# Patient Record
Sex: Male | Born: 1945 | Race: Black or African American | Hispanic: No | Marital: Married
Health system: Southern US, Community
[De-identification: ages and names within clinical notes are randomized; demographics above are authoritative.]

---

## 2005-08-15 ENCOUNTER — Emergency Department (HOSPITAL_COMMUNITY): Admission: EM | Admit: 2005-08-15 | Discharge: 2005-08-15 | Payer: Self-pay | Admitting: Emergency Medicine

## 2017-04-30 ENCOUNTER — Emergency Department (HOSPITAL_COMMUNITY): Payer: Medicare (Managed Care)

## 2017-04-30 ENCOUNTER — Emergency Department (HOSPITAL_COMMUNITY)
Admission: EM | Admit: 2017-04-30 | Discharge: 2017-05-01 | Disposition: A | Discharge status: Home / Self Care | Payer: Medicare (Managed Care) | Attending: Emergency Medicine | Admitting: Emergency Medicine

## 2017-04-30 DIAGNOSIS — Y999 Unspecified external cause status: Secondary | ICD-10-CM | POA: Diagnosis not present

## 2017-04-30 DIAGNOSIS — Y9389 Activity, other specified: Secondary | ICD-10-CM | POA: Diagnosis not present

## 2017-04-30 DIAGNOSIS — W228XXA Striking against or struck by other objects, initial encounter: Secondary | ICD-10-CM | POA: Diagnosis not present

## 2017-04-30 DIAGNOSIS — Y929 Unspecified place or not applicable: Secondary | ICD-10-CM | POA: Diagnosis not present

## 2017-04-30 DIAGNOSIS — Z79899 Other long term (current) drug therapy: Secondary | ICD-10-CM | POA: Insufficient documentation

## 2017-04-30 DIAGNOSIS — Z7984 Long term (current) use of oral hypoglycemic drugs: Secondary | ICD-10-CM | POA: Insufficient documentation

## 2017-04-30 DIAGNOSIS — S060X0A Concussion without loss of consciousness, initial encounter: Secondary | ICD-10-CM

## 2017-04-30 DIAGNOSIS — S0990XA Unspecified injury of head, initial encounter: Secondary | ICD-10-CM | POA: Diagnosis present

## 2017-04-30 DIAGNOSIS — Z7901 Long term (current) use of anticoagulants: Secondary | ICD-10-CM | POA: Insufficient documentation

## 2017-04-30 LAB — BASIC METABOLIC PANEL
Anion gap: 11 (ref 5–15)
BUN: 11 mg/dL (ref 6–20)
CALCIUM: 8.6 mg/dL — AB (ref 8.9–10.3)
CO2: 27 mmol/L (ref 22–32)
Chloride: 99 mmol/L — ABNORMAL LOW (ref 101–111)
Creatinine, Ser: 1.14 mg/dL (ref 0.61–1.24)
GFR calc Af Amer: >60 mL/min (ref >60)
GLUCOSE: 206 mg/dL — AB (ref 65–99)
Potassium: 4.1 mmol/L (ref 3.5–5.1)
Sodium: 137 mmol/L (ref 135–145)

## 2017-04-30 LAB — CBC
HEMATOCRIT: 51.4 % (ref 39.0–52.0)
Hemoglobin: 16.5 g/dL (ref 13.0–17.0)
MCH: 27.1 pg (ref 26.0–34.0)
MCHC: 32.1 g/dL (ref 30.0–36.0)
MCV: 84.4 fL (ref 78.0–100.0)
PLATELETS: 183 10*3/uL (ref 150–400)
RBC: 6.09 MIL/uL — ABNORMAL HIGH (ref 4.22–5.81)
RDW: 15.5 % (ref 11.5–15.5)
WBC: 11.1 10*3/uL — ABNORMAL HIGH (ref 4.0–10.5)

## 2017-04-30 LAB — I-STAT TROPONIN, ED: Troponin i, poc: 0 ng/mL (ref 0.00–0.08)

## 2017-04-30 NOTE — ED Triage Notes (Signed)
Pt states on 4/04 he closed his trunk on his head and became "swimming headed" afterwards and dizziness. Pt states the dizziness has been there ever since but comes and goes decided to come get checked out today.

## 2017-04-30 NOTE — ED Provider Notes (Signed)
Patient placed in Quick Look pathway, seen and evaluated   Chief Complaint: Intermittent dizziness  HPI: Patient presenting with episodes of intermittent dizziness over the past 10 days. He reports that he hit his head on the trunk of his vehicle 10 days ago and felt a little bit dizzy but did not get evaluated.  He describes the dizziness as "swimming head" room spinning and feeling like he was going to pass out. Positive anticoagulant use. Denies any headaches, visual disturbances, nausea, vomiting.  ROS: Reports chronic shortness of breath from COPD without worsening, no chest pain, headache, fever or chills.  Physical Exam:   Gen: No distress  Neuro: Awake and Alert  Skin: Warm    Focused Exam: Nontoxic afebrile sitting comfortably in chair in no acute distress.  Lungs with rhonchi bilaterally.    Initiation of care has begun. The patient has been counseled on the process, plan, and necessity for staying for the completion/evaluation, and the remainder of the medical screening examination    Gregary CromerMitchell, Ranie Chinchilla B, PA-C 04/30/17 1722    Margarita Grizzleay, Danielle, MD 05/01/17 1438

## 2017-05-01 ENCOUNTER — Encounter (HOSPITAL_COMMUNITY): Payer: Self-pay

## 2017-05-01 MED ORDER — ALBUTEROL SULFATE (2.5 MG/3ML) 0.083% IN NEBU
5.0000 mg | INHALATION_SOLUTION | Freq: Once | RESPIRATORY_TRACT | Status: AC
  Administered 2017-05-01: 5 mg via RESPIRATORY_TRACT
  Filled 2017-05-01: qty 6

## 2017-05-01 MED ORDER — MECLIZINE HCL 25 MG PO TABS
25.0000 mg | ORAL_TABLET | Freq: Once | ORAL | Status: AC
  Administered 2017-05-01: 25 mg via ORAL
  Filled 2017-05-01: qty 1

## 2017-05-01 MED ORDER — MECLIZINE HCL 25 MG PO TABS: 12.5000 mg | ORAL_TABLET | Freq: Three times a day (TID) | ORAL | 0 refills | Status: AC | PRN

## 2017-05-01 MED ORDER — IPRATROPIUM BROMIDE 0.02 % IN SOLN
0.5000 mg | Freq: Once | RESPIRATORY_TRACT | Status: AC
Start: 2017-05-01 — End: 2017-05-01
  Administered 2017-05-01: 0.5 mg via RESPIRATORY_TRACT
  Filled 2017-05-01: qty 2.5

## 2017-05-01 NOTE — Discharge Instructions (Addendum)
Get help right away if: °You have confusion or unusual drowsiness. °Others find it difficult to wake you up. °You have nausea or persistent, forceful vomiting. °You feel like you are moving when you are not (vertigo). Your eyes may move rapidly back and forth. °You have convulsions or faint. °You have severe, persistent headaches that are not relieved by medicine. °You cannot use your arms or legs normally. °One of your pupils is larger than the other. °You have clear or bloody discharge from your nose or ears. °Your problems are getting worse, not better. °

## 2017-05-01 NOTE — ED Provider Notes (Signed)
Medical screening examination/treatment/procedure(s) were conducted as a shared visit with non-physician practitioner(s) and myself.  I personally evaluated the patient during the encounter.  Pt presented to the ED, for evaluation of dizziness following a head injury.  CT scan was negative for any acute finding.  Her symptoms are likely consistent with a mild concussion.  Patient feels stable for discharge   Linwood DibblesKnapp, Patrice Matthew, MD 05/01/17 [phone]

## 2017-05-01 NOTE — ED Notes (Signed)
Pt updated on bed status.  

## 2017-05-01 NOTE — ED Notes (Signed)
Provided update to son via phone.

## 2017-05-01 NOTE — ED Notes (Signed)
ED Provider at bedside. 

## 2017-05-01 NOTE — ED Provider Notes (Signed)
[facility] EMERGENCY DEPARTMENT Provider Note   CSN: [id number] Arrival date & time: 04/30/17  1633     History   Chief Complaint Chief Complaint  Patient presents with  . Head Injury    HPI Stephen Schultz is a 72 y.o. male.  Who presents the emergency department for evaluation of dizziness after head injury.  Patient states that 4 days ago he was putting suitcase in the trunk of his car and when he lifted up he hit his head.  Patient states that he "knocked the hell out of myself!"  He has had intermittent bouts of dizziness that are worse with turning his head lasting about 3-5 minutes at a time.  Patient states that when he sits still it gets better.  It is worse when he wakes up in the morning.  He denies nausea, vomiting, ataxia, headaches.  He has a past medical history of COPD and is on chronic oxygen therapy.  Patient has had a productive cough but denies fevers or chills.  HPI  History reviewed. No pertinent past medical history.  There are no active problems to display for this patient.   History reviewed. No pertinent surgical history.      Home Medications    Prior to Admission medications   Medication Sig Start Date End Date Taking? Authorizing Provider  albuterol (PROVENTIL HFA;VENTOLIN HFA) 108 (90 Base) MCG/ACT inhaler Inhale 1-2 puffs into the lungs every 6 (six) hours as needed for wheezing or shortness of breath.   Yes [provider]  Azelastine HCl 0.15 % SOLN Place 2 sprays into the nose 2 (two) times daily.   Yes [provider]  bumetanide (BUMEX) 2 MG tablet Take 4 mg by mouth 2 (two) times daily.   Yes [provider]  dabigatran (PRADAXA) 150 MG CAPS capsule Take 150 mg by mouth 2 (two) times daily.   Yes [provider]  diclofenac sodium (VOLTAREN) 1 % GEL Apply 2 g topically 4 (four) times daily as needed (pain).   Yes [provider]  diltiazem (TIAZAC) 360 MG 24 hr capsule Take 360  mg by mouth daily.   Yes [provider]  fluticasone-salmeterol (ADVAIR HFA) 230-21 MCG/ACT inhaler Inhale 2 puffs into the lungs 2 (two) times daily.   Yes [provider]  glimepiride (AMARYL) 4 MG tablet Take 4 mg by mouth 2 (two) times daily.   Yes [provider]  lisinopril (PRINIVIL,ZESTRIL) 10 MG tablet Take 10 mg by mouth daily.   Yes [provider]  metoprolol tartrate (LOPRESSOR) 25 MG tablet Take 25 mg by mouth 2 (two) times daily.   Yes [provider]  nitroGLYCERIN (NITROSTAT) 0.4 MG SL tablet Place 0.4 mg under the tongue every 5 (five) minutes as needed for chest pain.   Yes [provider]  oxyCODONE (OXYCONTIN) 40 mg 12 hr tablet Take 40 mg by mouth every 8 (eight) hours.   Yes [provider]  pantoprazole (PROTONIX) 40 MG tablet Take 40 mg by mouth daily.   Yes [provider]  potassium chloride (K-DUR,KLOR-CON) 10 MEQ tablet Take 10 mEq by mouth daily.   Yes [provider]  sitaGLIPtin (JANUVIA) 100 MG tablet Take 100 mg by mouth daily.   Yes [provider]  tamsulosin (FLOMAX) 0.4 MG CAPS capsule Take 0.4 mg by mouth daily.   Yes [provider]  testosterone cypionate (DEPOTESTOSTERONE CYPIONATE) 200 MG/ML injection Inject 100 mg into the muscle every 14 (  fourteen) days.   Yes [provider]  tiotropium (SPIRIVA) 18 MCG inhalation capsule Place 18 mcg into inhaler and inhale daily.   Yes [provider]  meclizine (ANTIVERT) 25 MG tablet Take 0.5-1 tablets (12.5-25 mg total) by mouth 3 (three) times daily as needed for dizziness. 05/01/17   Arthor CaptainHarris, Alyric Parkin, PA-C    Family History No family history on file.  Social History Social History   Tobacco Use  . Smoking status: Not on file  Substance Use Topics  . Alcohol use: Not on file  . Drug use: Not on file     Allergies   Metformin and related and Morphine and related   Review of  Systems Review of Systems  Ten systems reviewed and are negative for acute change, except as noted in the HPI.   Physical Exam Updated Vital Signs BP (!) 153/92   Pulse 81   Temp 98.2 F (36.8 C)   Resp 19   Ht 6' (1.829 m)   Wt 97.5 kg (215 lb)   SpO2 96%   BMI 29.16 kg/m   Physical Exam  Constitutional: He is oriented to person, place, and time. He appears well-developed and well-nourished. No distress.  HENT:  Head: Normocephalic and atraumatic.  Eyes: Conjunctivae are normal. No scleral icterus.  Neck: Normal range of motion. Neck supple.  Cardiovascular: Normal rate, regular rhythm and normal heart sounds.  Pulmonary/Chest: Effort normal and breath sounds normal. No respiratory distress.  ronchi  Abdominal: Soft. There is no tenderness.  Musculoskeletal: He exhibits no edema.  Neurological: He is alert and oriented to person, place, and time.  Speech is clear and goal oriented, follows commands Major Cranial nerves without deficit, no facial droop Normal strength in upper and lower extremities bilaterally including dorsiflexion and plantar flexion, strong and equal grip strength Sensation normal to light and sharp touch Moves extremities without ataxia, coordination intact Normal finger to nose and rapid alternating movements Neg romberg, no pronator drift Normal gait Normal heel-shin and balance  Skin: Skin is warm and dry. He is not diaphoretic.  Psychiatric: His behavior is normal.  Nursing note and vitals reviewed.    ED Treatments / Results  Labs (all labs ordered are listed, but only abnormal results are displayed) Labs Reviewed  CBC - Abnormal; Notable for the following components:      Result Value   WBC 11.1 (*)    RBC 6.09 (*)    All other components within normal limits  BASIC METABOLIC PANEL - Abnormal; Notable for the following components:   Chloride 99 (*)    Glucose, Bld 206 (*)    Calcium 8.6 (*)    All other components within normal  limits  I-STAT TROPONIN, ED    EKG None  Radiology Dg Chest 2 View  Result Date: 04/30/2017 CLINICAL DATA:  Dizziness after closing the trunk of his vehicle on his head on 04/19/2017. EXAM: CHEST - 2 VIEW COMPARISON:  None. FINDINGS: Moderate-sized left diaphragmatic eventration. Borderline enlarged cardiac silhouette and tortuous and calcified thoracic aorta. Mildly prominent pulmonary vasculature. Mild linear atelectasis or scarring at both lung bases. Thoracic spine degenerative changes. Bilateral shoulder degenerative changes. IMPRESSION: 1. Moderate-sized left diaphragmatic eventration. 2. Borderline cardiomegaly and mild pulmonary vascular congestion. 3. Mild bibasilar linear atelectasis or scarring. Electronically Signed   By: Beckie SaltsSteven  Reid M.D.   On: 04/30/2017 18:42   Ct Head Wo Contrast  Result Date: 04/30/2017 CLINICAL DATA:  Chronic headache with dizziness EXAM: CT HEAD  WITHOUT CONTRAST TECHNIQUE: Contiguous axial images were obtained from the base of the skull through the vertex without intravenous contrast. COMPARISON:  08/15/2005 FINDINGS: Brain: No acute territorial infarction, hemorrhage or intracranial mass is visualized. Stable ventricle size. Mild small vessel ischemic changes of the white matter. Moderate atrophy. Possible small old lacunar infarct right basal ganglia. Vascular: No hyperdense vessels. Carotid vascular calcification and vertebral artery calcification. Skull: Normal. Negative for fracture or focal lesion. Sinuses/Orbits: No acute finding. Other: None. IMPRESSION: 1. No CT evidence for acute intracranial abnormality. 2. Atrophy with mild small vessel ischemic changes of the white matter. Electronically Signed   By: Jasmine Pang M.D.   On: 04/30/2017 18:21    Procedures Procedures (including critical care time)  Medications Ordered in ED Medications  albuterol (PROVENTIL) (2.5 MG/3ML) 0.083% nebulizer solution 5 mg (5 mg Nebulization Given 05/01/17 0655)   ipratropium (ATROVENT) nebulizer solution 0.5 mg (0.5 mg Nebulization Given 05/01/17 0655)  meclizine (ANTIVERT) tablet 25 mg (25 mg Oral Given 05/01/17 0654)     Initial Impression / Assessment and Plan / ED Course  I have reviewed the triage vital signs and the nursing notes.  Pertinent labs & imaging results that were available during my care of the patient were reviewed by me and considered in my medical decision making (see chart for details).  Clinical Course as of May 02 819  Tue May 01, 2017  0636 DG Chest 2 View [AH]    Clinical Course User Index [AH] Arthor Captain, PA-C    7:01 AM BP (!) 158/97   Pulse 80   Temp 98.2 F (36.8 C)   Resp 19   Ht 6' (1.829 m)   Wt 97.5 kg (215 lb)   SpO2 98%   BMI 29.16 kg/m  Patient CT negative with normal neuro exam. The patient will be given a duoneb and meclizine.  CXR shows chronic diaphragmatic abnormality without evidence of pneumonia  8:18 AM BP (!) 153/92   Pulse 81   Temp 98.2 F (36.8 C)   Resp 19   Ht 6' (1.829 m)   Wt 97.5 kg (215 lb)   SpO2 96%   BMI 29.16 kg/m   The patient is ambulatory in the ED. He is improved after breathing tx. Seen in shared visit and Dr, Lynelle Doctor agrees with discharge.   Final Clinical Impressions(s) / ED Diagnoses   Final diagnoses:  Concussion without loss of consciousness, initial encounter    ED Discharge Orders        Ordered    meclizine (ANTIVERT) 25 MG tablet  3 times daily PRN     05/01/17 0756       Arthor Captain, PA-C 05/01/17 1610    Linwood Dibbles, MD 05/01/17 1500

## 2019-09-04 IMAGING — CT CT HEAD W/O CM
4 series · 15 of 47 positions shown, 17 images · non-contrast
Comparison: 08/15/2005

CLINICAL DATA: Chronic headache with dizziness

EXAM:
CT HEAD WITHOUT CONTRAST
TECHNIQUE: Contiguous axial images were obtained from the base of the skull
through the vertex without intravenous contrast.

[Series 3: head wo · axial · 0.45mm/px · z∈[-91,+34]mm · 7 of 35 slices shown, 9 images]
[im 5/35  brain]
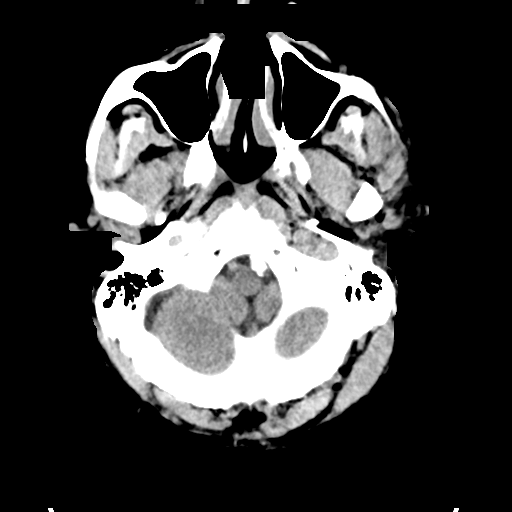
[im 5/35  bone]
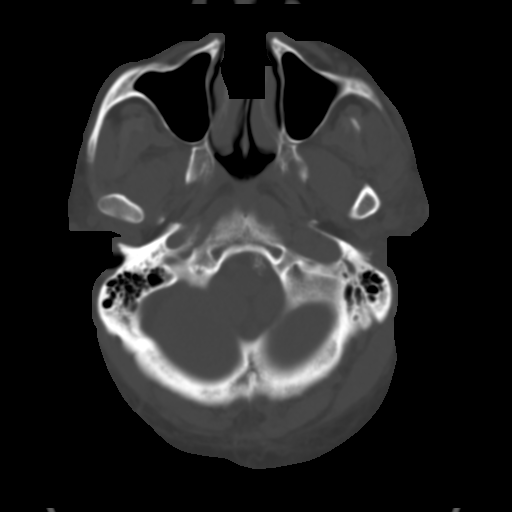
[im 9/35  brain]
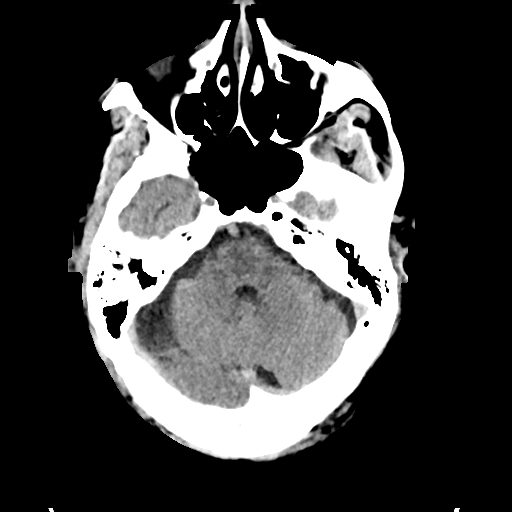
[im 13/35  brain]
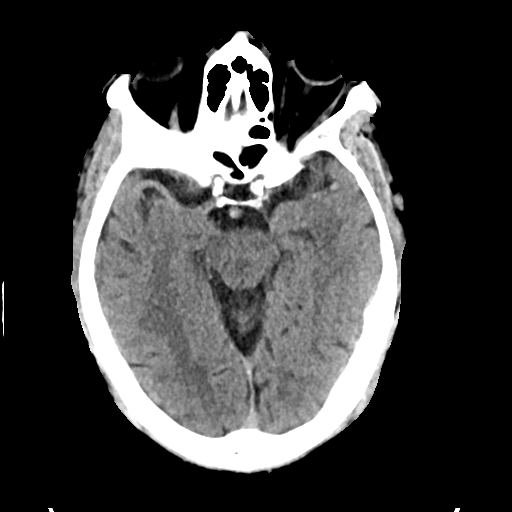
[im 18/35  brain]
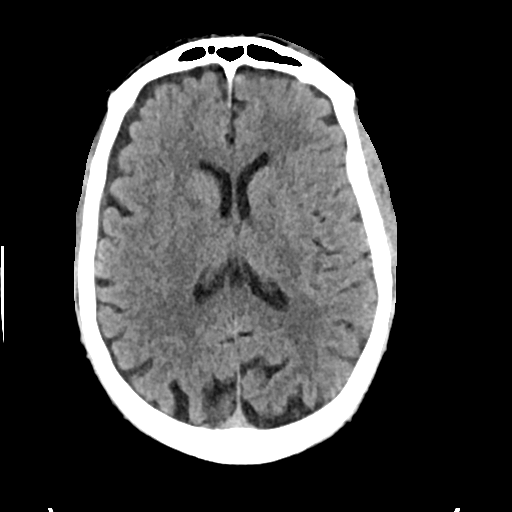
[im 22/35  brain]
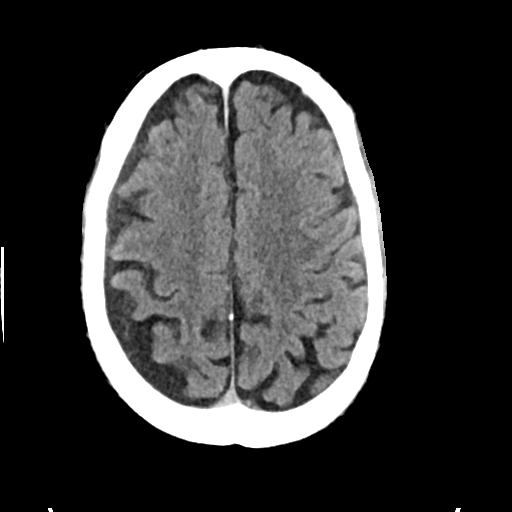
[im 22/35  bone]
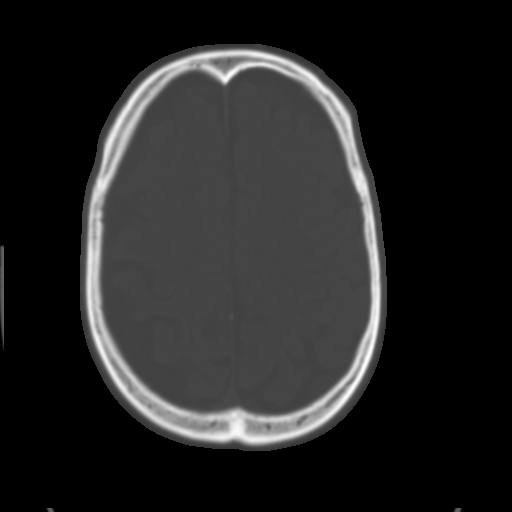
[im 26/35  brain]
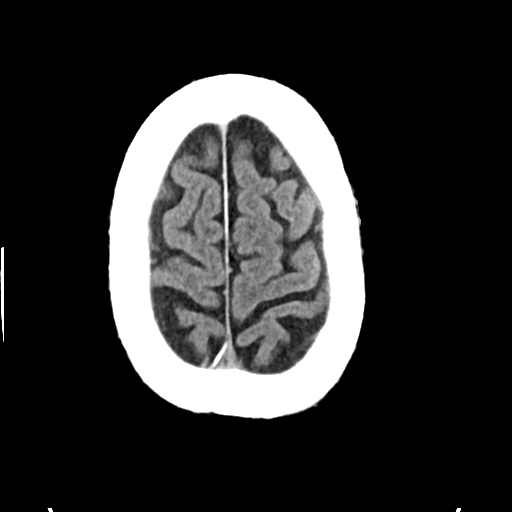
[im 30/35  brain]
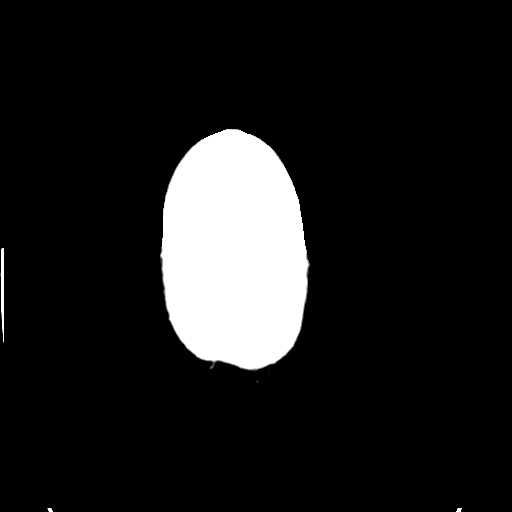

[Series 4: head bone · axial · 0.45mm/px · z∈[-95,-77]mm · 2 of 86 slices shown]
[im 9/86  bone]
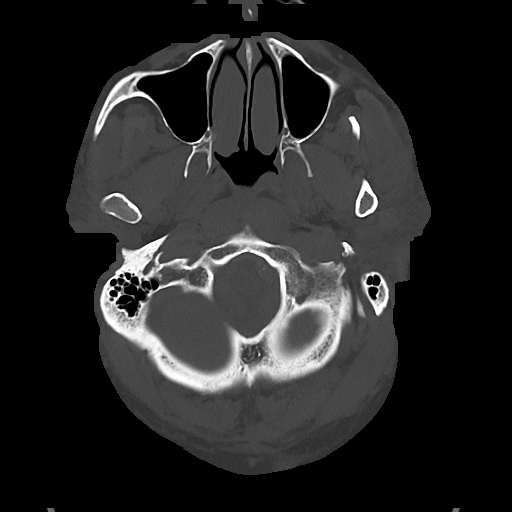
[im 18/86  bone]
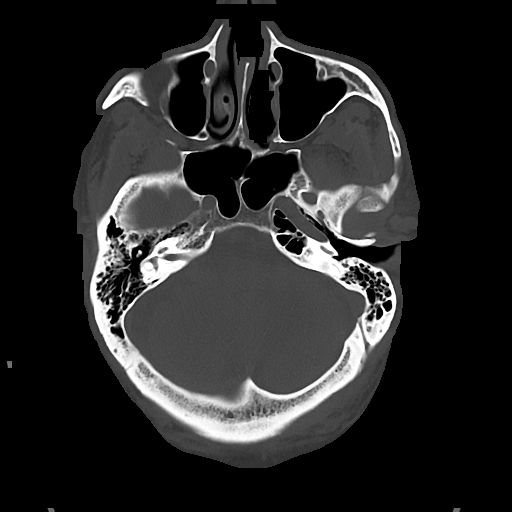

[Series 5: cor soft · coronal · 0.31mm/px · 3 of 77 slices shown]
[im 26/77  brain]
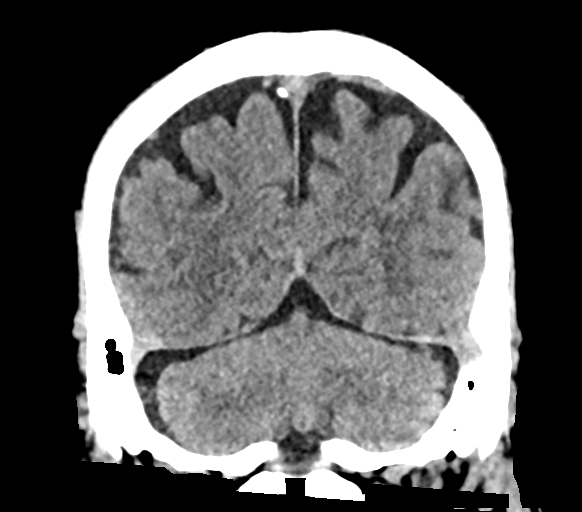
[im 34/77  brain]
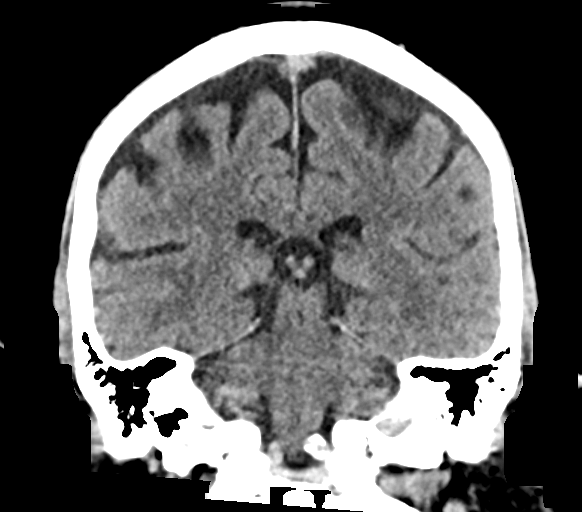
[im 43/77  brain]
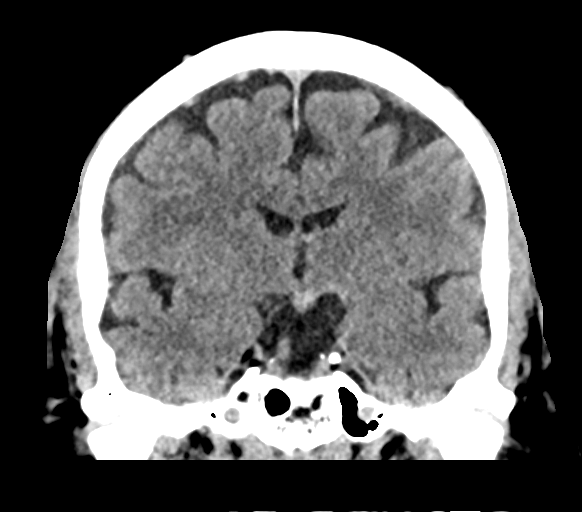

[Series 6: sag soft · sagittal · 0.33mm/px · 3 of 60 slices shown]
[im 20/60  brain]
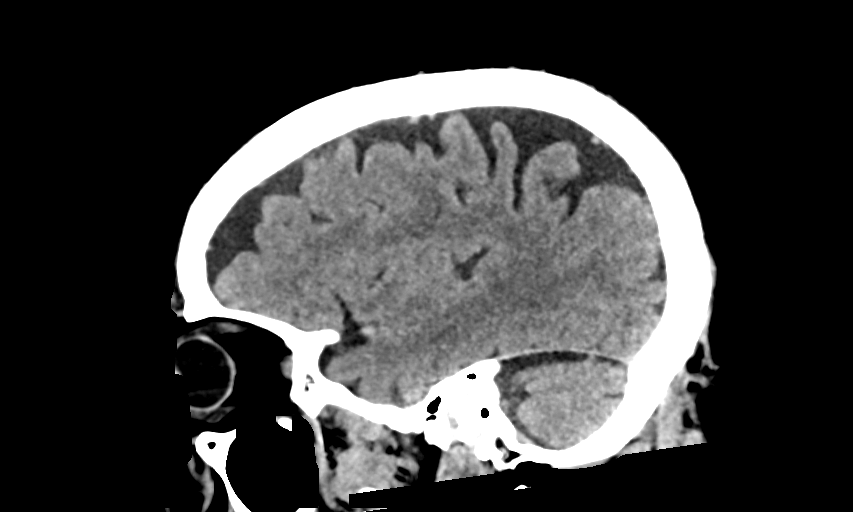
[im 30/60  brain]
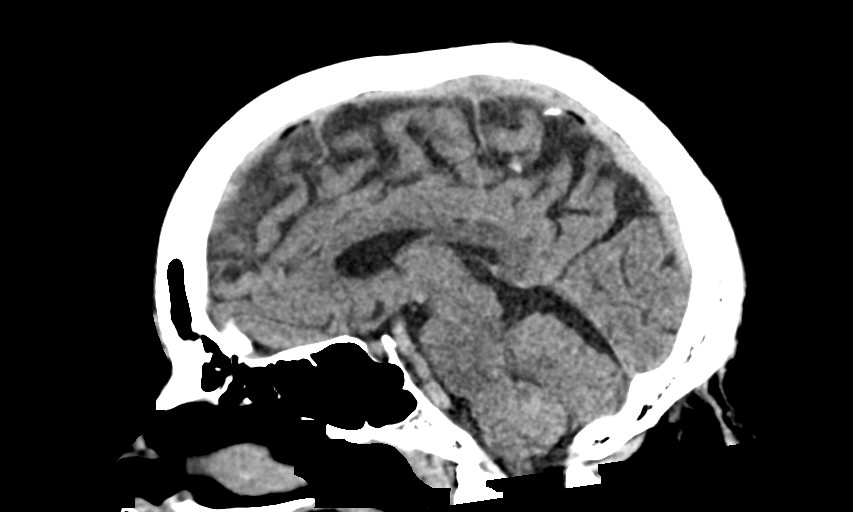
[im 40/60  brain]
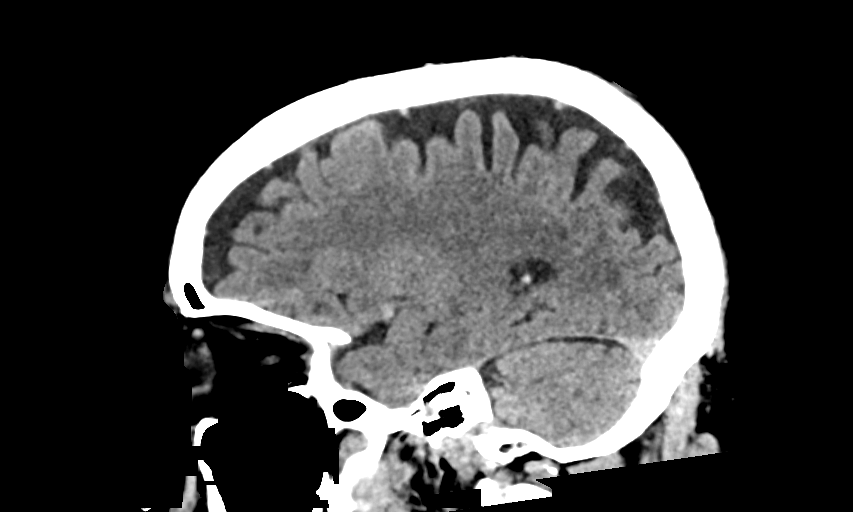

[15 of 47 positions shown; findings below may reference images not displayed]

FINDINGS: Brain: No acute territorial infarction, hemorrhage or intracranial
mass is visualized. Stable ventricle size. Mild small vessel
ischemic changes of the white matter. Moderate atrophy. Possible
small old lacunar infarct right basal ganglia.

Vascular: No hyperdense vessels. Carotid vascular calcification and
vertebral artery calcification.

Skull: Normal. Negative for fracture or focal lesion.

Sinuses/Orbits: No acute finding.

Other: None.
IMPRESSION: 1. No CT evidence for acute intracranial abnormality.
2. Atrophy with mild small vessel ischemic changes of the white
matter.
